# Patient Record
Sex: Female | Born: 1975 | Race: White | Hispanic: No | Smoking: Current every day smoker
Health system: Southern US, Community
[De-identification: ages and names within clinical notes are randomized; demographics above are authoritative.]

## PROBLEM LIST (undated history)

## (undated) DIAGNOSIS — R569 Unspecified convulsions: Secondary | ICD-10-CM

## (undated) DIAGNOSIS — I2699 Other pulmonary embolism without acute cor pulmonale: Secondary | ICD-10-CM

## (undated) DIAGNOSIS — I82409 Acute embolism and thrombosis of unspecified deep veins of unspecified lower extremity: Secondary | ICD-10-CM

## (undated) DIAGNOSIS — I10 Essential (primary) hypertension: Secondary | ICD-10-CM

## (undated) DIAGNOSIS — I639 Cerebral infarction, unspecified: Secondary | ICD-10-CM

---

## 2016-09-14 ENCOUNTER — Encounter (HOSPITAL_COMMUNITY): Payer: Self-pay | Admitting: Emergency Medicine

## 2016-09-14 ENCOUNTER — Emergency Department (HOSPITAL_COMMUNITY)
Admission: EM | Admit: 2016-09-14 | Discharge: 2016-09-14 | Disposition: A | Discharge status: Home / Self Care | Payer: Self-pay | Attending: Emergency Medicine | Admitting: Emergency Medicine

## 2016-09-14 DIAGNOSIS — Y9389 Activity, other specified: Secondary | ICD-10-CM | POA: Insufficient documentation

## 2016-09-14 DIAGNOSIS — W57XXXA Bitten or stung by nonvenomous insect and other nonvenomous arthropods, initial encounter: Secondary | ICD-10-CM | POA: Insufficient documentation

## 2016-09-14 DIAGNOSIS — Y999 Unspecified external cause status: Secondary | ICD-10-CM | POA: Insufficient documentation

## 2016-09-14 DIAGNOSIS — Y929 Unspecified place or not applicable: Secondary | ICD-10-CM | POA: Insufficient documentation

## 2016-09-14 DIAGNOSIS — F17210 Nicotine dependence, cigarettes, uncomplicated: Secondary | ICD-10-CM | POA: Insufficient documentation

## 2016-09-14 DIAGNOSIS — T63441A Toxic effect of venom of bees, accidental (unintentional), initial encounter: Secondary | ICD-10-CM | POA: Insufficient documentation

## 2016-09-14 DIAGNOSIS — I10 Essential (primary) hypertension: Secondary | ICD-10-CM | POA: Insufficient documentation

## 2016-09-14 HISTORY — DX: Acute embolism and thrombosis of unspecified deep veins of unspecified lower extremity: I82.409

## 2016-09-14 HISTORY — DX: Unspecified convulsions: R56.9

## 2016-09-14 HISTORY — DX: Other pulmonary embolism without acute cor pulmonale: I26.99

## 2016-09-14 HISTORY — DX: Essential (primary) hypertension: I10

## 2016-09-14 MED ORDER — DIPHENHYDRAMINE HCL 25 MG PO TABS: 25.0000 mg | ORAL_TABLET | Freq: Four times a day (QID) | ORAL | 0 refills | Status: AC

## 2016-09-14 NOTE — Discharge Instructions (Signed)
Read the information below.  Use the prescribed medication as directed.  Please discuss all new medications with your pharmacist.  You may return to the Emergency Department at any time for worsening condition or any new symptoms that concern you.   If you develop increased redness, swelling, pus draining from the wound, or fevers greater than 100.4, return to the ER immediately for a recheck.   °

## 2016-09-14 NOTE — ED Triage Notes (Signed)
Pt here with redness and pain to left arm from bee stings yesterday

## 2016-09-14 NOTE — ED Provider Notes (Signed)
[facility]-EMERGENCY DEPT Provider Note   CSN: [id number] Arrival date & time: 09/14/16  1359  By signing my name below, I, Sonum Patel, attest that this documentation has been prepared under the direction and in the presence of [facility], PA-C. Electronically Signed: Leone Payor, Scribe. 09/14/16. 2:56 PM.  History   Chief Complaint Chief Complaint  Patient presents with  . Allergic Reaction    The history is provided by the patient. No language interpreter was used.     HPI Comments: Jordan Butler is a 41 y.o. female who presents to the Emergency Department complaining of gradual onset, constant redness and pain to the left forearm that started today after bee stings. She states she was attempting to remove a nest when she was stung yesterday but her symptoms didn't start until today. She applied rubbing alcohol and hand sanitizer to the affected area which helped the itching. She notes associated minimal tongue itching.. Took benadryl this morning with relief.  Denies swelling of the tongue or difficulty swallowing or breathing.  No weakness or numbness of the left hand.    Past Medical History:  Diagnosis Date  . DVT (deep venous thrombosis) (HCC)   . Hypertension   . Pulmonary emboli (HCC)   . Seizures (HCC)     There are no active problems to display for this patient.   History reviewed. No pertinent surgical history.  OB History    No data available       Home Medications    Prior to Admission medications   Medication Sig Start Date End Date Taking? Authorizing Provider  diphenhydrAMINE (BENADRYL) 25 MG tablet Take 1 tablet (25 mg total) by mouth every 6 (six) hours. 09/14/16   Trixie Dredge, PA-C    Family History History reviewed. No pertinent family history.  Social History Social History  Substance Use Topics  . Smoking status: Current Every Day Smoker  . Smokeless tobacco: Never Used  . Alcohol use Yes     Comment: occ     Allergies   Patient has no known  allergies.   Review of Systems Review of Systems  Constitutional: Negative for fever.  HENT: Negative for facial swelling, sore throat and trouble swallowing.   Respiratory: Negative for choking, shortness of breath, wheezing and stridor.   Skin: Positive for rash and wound.  Allergic/Immunologic: Negative for immunocompromised state.     Physical Exam Updated Vital Signs BP (!) 171/107 (BP Location: Right Arm)   Pulse 82   Temp 98.3 F (36.8 C) (Oral)   Resp 18   SpO2 97%   Physical Exam  Constitutional: She appears well-developed and well-nourished. No distress.  HENT:  Head: Normocephalic and atraumatic.  Mouth/Throat: Oropharynx is clear and moist. No oropharyngeal exudate.  Eyes: Conjunctivae are normal.  Neck: Neck supple.  Cardiovascular: Normal rate and regular rhythm.   Pulmonary/Chest: Effort normal and breath sounds normal. No respiratory distress. She has no wheezes. She has no rales.  Musculoskeletal: Normal range of motion. She exhibits edema. She exhibits no tenderness.  Neurological: She is alert. No sensory deficit.  Skin: Skin is warm and dry. She is not diaphoretic. There is erythema.  Left volar forearm with erythema, edema, and warmth. No tenderness. No discharge. Left hand with full active ROM. Pulses intact. Sensation intact.   Nursing note and vitals reviewed.    ED Treatments / Results  DIAGNOSTIC STUDIES: Oxygen Saturation is 97% on RA, normal by my interpretation.    COORDINATION OF CARE: 2:56  PM Discussed treatment plan with pt at bedside and pt agreed to plan.   Labs (all labs ordered are listed, but only abnormal results are displayed) Labs Reviewed - No data to display  EKG  EKG Interpretation None       Radiology No results found.  Procedures Procedures (including critical care time)  Medications Ordered in ED Medications - No data to display   Initial Impression / Assessment and Plan / ED Course  I have reviewed the  triage vital signs and the nursing notes.  Pertinent labs & imaging results that were available during my care of the patient were reviewed by me and considered in my medical decision making (see chart for details).     Afebrile, nontoxic patient with multiple bee stings to left volar forearm that occurred yesterday.  Has localized erythema, edema related to localized reaction.  Doubt superinfection.  No airway concerns.   D/C home with benadryl, ice, elevation, return precautions.  Discussed result, findings, treatment, and follow up  with patient.  Pt given return precautions.  Pt verbalizes understanding and agrees with plan.       Final Clinical Impressions(s) / ED Diagnoses   Final diagnoses:  Local reaction to bee sting, accidental or unintentional, initial encounter    New Prescriptions Discharge Medication List as of 09/14/2016  2:54 PM    START taking these medications   Details  diphenhydrAMINE (BENADRYL) 25 MG tablet Take 1 tablet (25 mg total) by mouth every 6 (six) hours., Starting Thu 09/14/2016, Print       I personally performed the services described in this documentation, which was scribed in my presence. The recorded information has been reviewed and is accurate.    Trixie DredgeWest, September Mormile, PA-C 09/14/16 1548    Long, Arlyss RepressJoshua G, MD 09/15/16 [phone]

## 2016-12-13 ENCOUNTER — Emergency Department (HOSPITAL_COMMUNITY)
Admission: EM | Admit: 2016-12-13 | Discharge: 2016-12-13 | Disposition: A | Discharge status: Home / Self Care | Payer: Self-pay | Attending: Emergency Medicine | Admitting: Emergency Medicine

## 2016-12-13 ENCOUNTER — Other Ambulatory Visit: Payer: Self-pay

## 2016-12-13 ENCOUNTER — Emergency Department (HOSPITAL_COMMUNITY): Payer: Self-pay

## 2016-12-13 ENCOUNTER — Encounter (HOSPITAL_COMMUNITY): Payer: Self-pay | Admitting: Pharmacy Technician

## 2016-12-13 DIAGNOSIS — R202 Paresthesia of skin: Secondary | ICD-10-CM | POA: Insufficient documentation

## 2016-12-13 DIAGNOSIS — Z79899 Other long term (current) drug therapy: Secondary | ICD-10-CM | POA: Insufficient documentation

## 2016-12-13 DIAGNOSIS — Z7982 Long term (current) use of aspirin: Secondary | ICD-10-CM | POA: Insufficient documentation

## 2016-12-13 DIAGNOSIS — Z8673 Personal history of transient ischemic attack (TIA), and cerebral infarction without residual deficits: Secondary | ICD-10-CM | POA: Insufficient documentation

## 2016-12-13 DIAGNOSIS — R791 Abnormal coagulation profile: Secondary | ICD-10-CM

## 2016-12-13 DIAGNOSIS — I10 Essential (primary) hypertension: Secondary | ICD-10-CM

## 2016-12-13 DIAGNOSIS — G43909 Migraine, unspecified, not intractable, without status migrainosus: Secondary | ICD-10-CM

## 2016-12-13 HISTORY — DX: Cerebral infarction, unspecified: I63.9

## 2016-12-13 LAB — I-STAT TROPONIN, ED: TROPONIN I, POC: 0 ng/mL (ref 0.00–0.08)

## 2016-12-13 LAB — PROTIME-INR
INR: 1.04
Prothrombin Time: 13.5 seconds (ref 11.4–15.2)

## 2016-12-13 MED ORDER — KETOROLAC TROMETHAMINE 15 MG/ML IJ SOLN
15.0000 mg | Freq: Once | INTRAMUSCULAR | Status: AC
  Administered 2016-12-13: 15 mg via INTRAVENOUS
  Filled 2016-12-13: qty 1

## 2016-12-13 MED ORDER — SODIUM CHLORIDE 0.9 % IV BOLUS (SEPSIS)
1000.0000 mL | Freq: Once | INTRAVENOUS | Status: AC
  Administered 2016-12-13: 1000 mL via INTRAVENOUS

## 2016-12-13 MED ORDER — METOCLOPRAMIDE HCL 5 MG/ML IJ SOLN
5.0000 mg | Freq: Once | INTRAMUSCULAR | Status: AC
  Administered 2016-12-13: 5 mg via INTRAVENOUS
  Filled 2016-12-13: qty 2

## 2016-12-13 MED ORDER — OXYCODONE-ACETAMINOPHEN 5-325 MG PO TABS
1.0000 | ORAL_TABLET | Freq: Once | ORAL | Status: AC
  Administered 2016-12-13: 1 via ORAL
  Filled 2016-12-13: qty 1

## 2016-12-13 MED ORDER — DIPHENHYDRAMINE HCL 50 MG/ML IJ SOLN
25.0000 mg | Freq: Once | INTRAMUSCULAR | Status: AC
  Administered 2016-12-13: 25 mg via INTRAVENOUS
  Filled 2016-12-13: qty 1

## 2016-12-13 MED ORDER — WARFARIN SODIUM 5 MG PO TABS: 5.0000 mg | ORAL_TABLET | Freq: Every day | ORAL | 0 refills | Status: AC

## 2016-12-13 MED ORDER — MAGNESIUM SULFATE 2 GM/50ML IV SOLN
2.0000 g | Freq: Once | INTRAVENOUS | Status: AC
  Administered 2016-12-13: 2 g via INTRAVENOUS
  Filled 2016-12-13: qty 50

## 2016-12-13 MED ORDER — CLONIDINE HCL 0.1 MG PO TABS
0.1000 mg | ORAL_TABLET | Freq: Once | ORAL | Status: AC
  Administered 2016-12-13: 0.1 mg via ORAL
  Filled 2016-12-13: qty 1

## 2016-12-13 NOTE — ED Provider Notes (Signed)
[facility] Provider Note   CSN: [id number] Arrival date & time: 12/13/16  2008  History   Chief Complaint Chief Complaint  Patient presents with  . Migraine   HPI Jordan Butler is a 41 y.o. female.  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  Her pain began at 3:30AM this morning and has been gradually worsening throughout the day.  She tried placing ice packs onto her neck and head without relief.  Throughout the day, her headache worsened.  She tried Tramadol, Tylenol, ASA, Topamax, and Benadryl without relief of symptoms.  The headache is generalized and associated with photophobia.  She then noticed blurring of vision that started at ~4:45pm today.  Shortly thereafter, she began experiencing chest pressure as well as diaphoresis and nausea; these symptoms resolved with Zofran and lasted 5-10 minutes.  She has had intermittent numbness and tingling in her left upper and left lower extremity today. At this time, her headache is 10/10 in severity.  Per EMS report, the patient's initial BP was 230/183mmHg.   The history is provided by the patient, medical records and the EMS personnel. No language interpreter was used.   Past Medical History:  Diagnosis Date  . DVT (deep venous thrombosis) (HCC)   . Hypertension   . Pulmonary emboli (HCC)   . Seizures (HCC)   . Stroke [facility])    There are no active problems to display for this patient.  History reviewed. No pertinent surgical history.  OB History    No data available     Home Medications    Prior to Admission medications   Medication Sig Start Date End Date Taking? Authorizing Provider  acetaminophen (TYLENOL) 500 MG tablet Take 1,000 mg by mouth every 6 (six) hours as needed (for headaches).   Yes [provider]  aspirin EC 325 MG  tablet Take 325 mg by mouth 2 (two) times daily.   Yes [provider]  clonazePAM (KLONOPIN) 0.5 MG tablet Take 0.5-1 mg by mouth See admin instructions. 0.5 mg two times a day and 1 mg at bedtime   Yes [provider]  cloNIDine (CATAPRES) 0.1 MG tablet Take 0.1 mg by mouth 2 (two) times daily.   Yes [provider]  cyclobenzaprine (FLEXERIL) 10 MG tablet Take 10 mg by mouth 3 (three) times daily as needed for muscle spasms.   Yes [provider]  diphenhydrAMINE (BENADRYL) 25 MG tablet Take 1 tablet (25 mg total) by mouth every 6 (six) hours. Patient taking differently: Take 25 mg by mouth every 6 (six) hours as needed for allergies.  09/14/16  Yes West, Emily, PA-C  hydrochlorothiazide (HYDRODIURIL) 25 MG tablet Take 25 mg by mouth daily.   Yes [provider]  levETIRAcetam (KEPPRA) 750 MG tablet Take 1,500 mg by mouth 2 (two) times daily.   Yes [provider]  lisinopril-hydrochlorothiazide (PRINZIDE,ZESTORETIC) 20-12.5 MG tablet Take 1 tablet by mouth 2 (two) times daily.   Yes [provider]  topiramate (TOPAMAX) 25 MG tablet Take 50 mg by mouth 4 (four) times daily.   Yes [provider]  traMADol (ULTRAM) 50 MG tablet Take 50 mg by mouth every 6 (six) hours as needed (for pain).   Yes [provider]  warfarin (COUMADIN) 5 MG tablet Take 1 tablet (5 mg total)  by mouth at bedtime. Sun/Mon/Tues/Wed/Thurs/Fri/Sat 12/13/16 12/27/16  Levester Fresh, MD   Family History No family history on file.  Social History Social History  Substance Use Topics  . Smoking status: Current Every Day Smoker  . Smokeless tobacco: Never Used  . Alcohol use Yes     Comment: occ   Allergies   Latex  Review of Systems Review of Systems  Constitutional: Negative for chills and fever.  HENT: Negative for ear pain and sore throat.   Eyes: Positive for photophobia. Negative for pain and visual disturbance.  Respiratory:  Negative for cough and shortness of breath.   Cardiovascular: Positive for chest pain (now resolved). Negative for palpitations.  Gastrointestinal: Positive for nausea. Negative for abdominal pain, blood in stool, constipation, diarrhea and vomiting.  Genitourinary: Negative for dysuria and hematuria.  Musculoskeletal: Negative for arthralgias and back pain.  Skin: Negative for color change and rash.  Allergic/Immunologic: Negative for immunocompromised state.  Neurological: Positive for numbness (LUE, LLE) and headaches. Negative for seizures and syncope.  Hematological: Negative.   Psychiatric/Behavioral: Negative.   All other systems reviewed and are negative.  Physical Exam Updated Vital Signs BP (!) 181/128   Pulse 71   Temp 97.8 F (36.6 C) (Oral)   Resp 18   SpO2 97%   Physical Exam  Constitutional: She is oriented to person, place, and time. She appears well-developed and well-nourished. No distress.  HENT:  Head: Normocephalic and atraumatic.  Mouth/Throat: Oropharynx is clear and moist.  Eyes: Pupils are equal, round, and reactive to light. Conjunctivae are normal.  Pupils dilated and equal  Neck: Neck supple.  Cardiovascular: Normal rate, regular rhythm, normal heart sounds and intact distal pulses.   No murmur heard. Pulmonary/Chest: Effort normal and breath sounds normal. No respiratory distress. She has no wheezes.  Abdominal: Soft. There is no tenderness. There is no guarding.  Musculoskeletal: She exhibits no edema.  Neurological: She is alert and oriented to person, place, and time. She displays normal reflexes. No cranial nerve deficit or sensory deficit. She exhibits normal muscle tone. Coordination normal.  Skin: Skin is warm and dry.  Psychiatric: She has a normal mood and affect. Her behavior is normal. Judgment and thought content normal.  Nursing note and vitals reviewed.  ED Treatments / Results  Labs (all labs ordered are listed, but only abnormal  results are displayed) Labs Reviewed  PROTIME-INR  RAPID URINE DRUG SCREEN, HOSP PERFORMED  I-STAT TROPONIN, ED    EKG  EKG Interpretation None      Radiology Ct Head Wo Contrast  Result Date: 12/13/2016 CLINICAL DATA:  41 year old female with a history of headache and photophobia EXAM: CT HEAD WITHOUT CONTRAST TECHNIQUE: Contiguous axial images were obtained from the base of the skull through the vertex without intravenous contrast. COMPARISON:  None. FINDINGS: Brain: No acute intracranial hemorrhage. No midline shift or mass effect. Gray-white differentiation maintained. Unremarkable appearance of the ventricular system. Vascular: Unremarkable. Skull: No acute fracture.  No aggressive bone lesion identified. Sinuses/Orbits: Unremarkable appearance of the orbits. Mastoid air cells clear. No middle ear effusion. No significant sinus disease. Other: None IMPRESSION: No CT evidence of acute intracranial abnormality. Electronically Signed   By: Gilmer Mor D.O.   On: 12/13/2016 21:15    Procedures Procedures (including critical care time)  Medications Ordered in ED Medications  sodium chloride 0.9 % bolus 1,000 mL (0 mLs Intravenous Stopped 12/13/16 2349)  diphenhydrAMINE (BENADRYL) injection 25 mg (25 mg Intravenous Given 12/13/16 2122)  metoCLOPramide (  REGLAN) injection 5 mg (5 mg Intravenous Given 12/13/16 2122)  ketorolac (TORADOL) 15 MG/ML injection 15 mg (15 mg Intravenous Given 12/13/16 2228)  cloNIDine (CATAPRES) tablet 0.1 mg (0.1 mg Oral Given 12/13/16 2227)  oxyCODONE-acetaminophen (PERCOCET/ROXICET) 5-325 MG per tablet 1 tablet (1 tablet Oral Given 12/13/16 2227)  magnesium sulfate IVPB 2 g 50 mL (0 g Intravenous Stopped 12/13/16 2350)    Initial Impression / Assessment and Plan / ED Course  I have reviewed the triage vital signs and the nursing notes.  Pertinent labs & imaging results that were available during my care of the patient were reviewed by me and considered  in my medical decision making (see chart for details).    At the time of my initial assessment, the patient's blood pressure was 151/106 with a normal heart rate.  She reported that she took her home anti-hypertensives today as prescribed.  My initial differential diagnosis included migraine headache, tension headache, CVA, drug ingestion, medication non-compliance, hypertensive urgency, and ACS.  Pertinent labs included a normal troponin and a subtherapeutic INR 1.04.  EKG within normal heart rate in normal sinus rhythm. No axis deviation. Normal PR interval, narrow QRS complex, and mildly prolonged QTc.  No evidence of arrhythmia, ischemia, or infarct.  Imaging studies included a head CT with no acute intracranial abnormalities.  The patient was given a migraine cocktail for her symptoms with minimal relief upon re-evaluation (7/10 in severity).  The patient was also given clonidine, which is a home medication, for her blood pressure, which increased to 190s/130s.  I then administered Percocet as well as IV magnesium, with significant reduction in headache severity to 3/10.    Based on the above findings, I suspect the patient is most likely suffering from a migraine headache at this time.  No hemorrhagic stroke on head CT.  Her headache symptoms may also be exacerbated by her current hypertension.    I discussed the above results with the patient who verbalized understanding.  Return precautions and follow-up plans discussed.  I gave the patient specific instructions to follow up within the next 2 days with her PCP. She was given a 2 week prescription for warfarin, and told to take this medication daily as prescribed, again with close follow-up for INR reevaluation.  The patient was able to ambulate without assistance and was discharged in stable condition.  Final Clinical Impressions(s) / ED Diagnoses   Final diagnoses:  Migraine without status migrainosus, not intractable, unspecified migraine type    Hypertension, unspecified type  Subtherapeutic international normalized ratio (INR)   New Prescriptions Discharge Medication List as of 12/13/2016 11:28 PM       Levester Fresh, MD 12/13/16 2359    Cathren Laine, MD 12/14/16 0011

## 2016-12-13 NOTE — ED Triage Notes (Signed)
Pt presents to the ED via [facility] with reports of HTN/Migraine onset at 0330 today. Pt with hx HTN, seizures, stroke January this year. Pt presents with photophobia. Stroke screen 0 with EMS. Initial BP with EMS 230/150. Pain 7/10 upon arrival. Other VSS.

## 2016-12-13 NOTE — ED Notes (Signed)
Patient transported to CT 

## 2016-12-13 NOTE — Discharge Instructions (Addendum)
Take all of your medications as prescribed.  Follow-up with your PCP within the next 2-3 days.  Your INR was subtherapeutic today, and you have been given a 2-week supply of Warfarin.  Please see your primary doctor for further evaluation.

## 2018-07-17 IMAGING — CT CT HEAD W/O CM
3 series · 16 of 47 positions shown, 19 images · non-contrast
Comparison: None.

CLINICAL DATA: 41-year-old female with a history of headache and
photophobia

EXAM:
CT HEAD WITHOUT CONTRAST
TECHNIQUE: Contiguous axial images were obtained from the base of the skull
through the vertex without intravenous contrast.

[Series 3: head 5.0 h30s · axial · 0.43mm/px · z∈[+128,+268]mm · 10 of 34 slices shown, 13 images]
[im 3/34  brain]
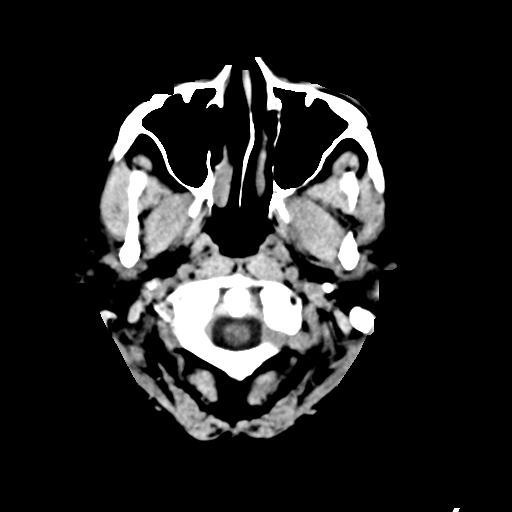
[im 3/34  bone]
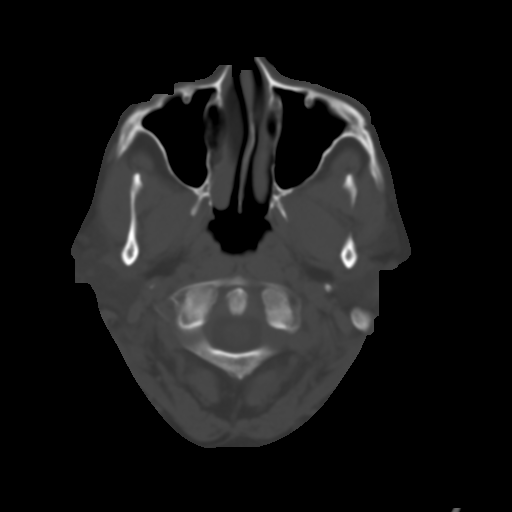
[im 6/34  brain]
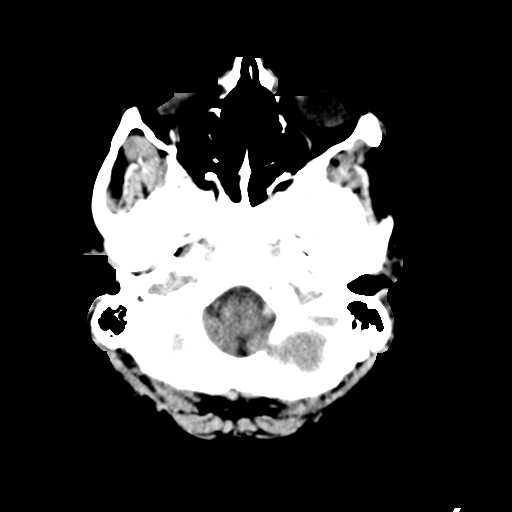
[im 10/34  brain]
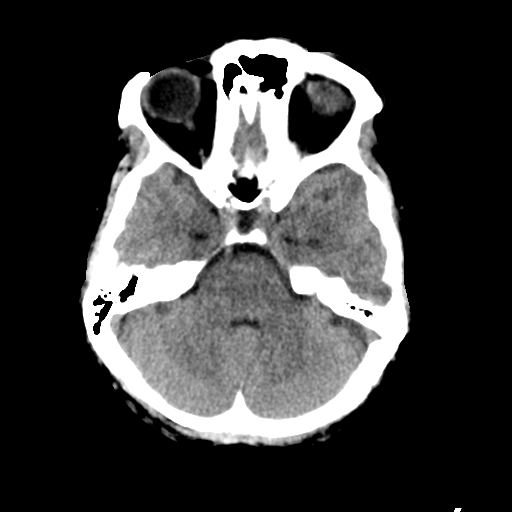
[im 12/34  brain]
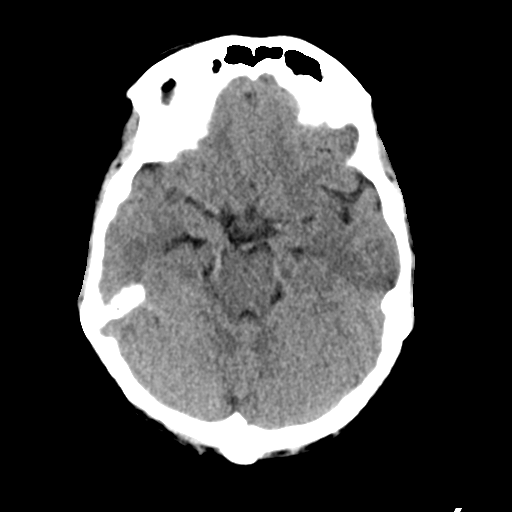
[im 15/34  brain]
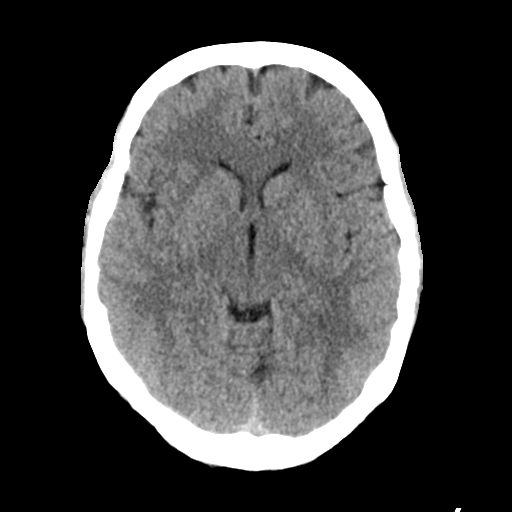
[im 15/34  bone]
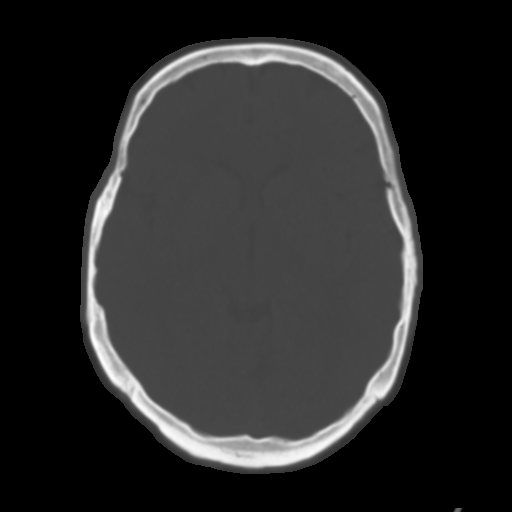
[im 19/34  brain]
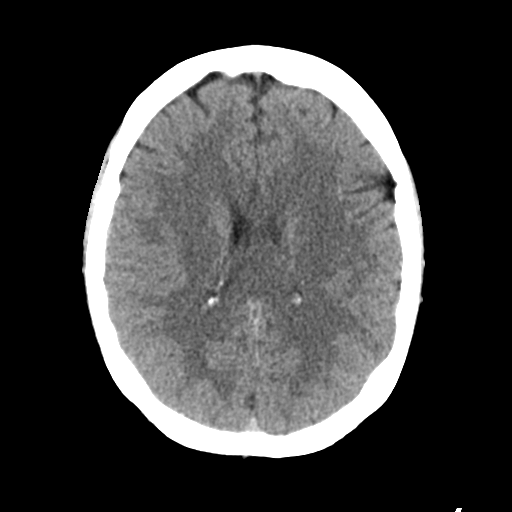
[im 22/34  brain]
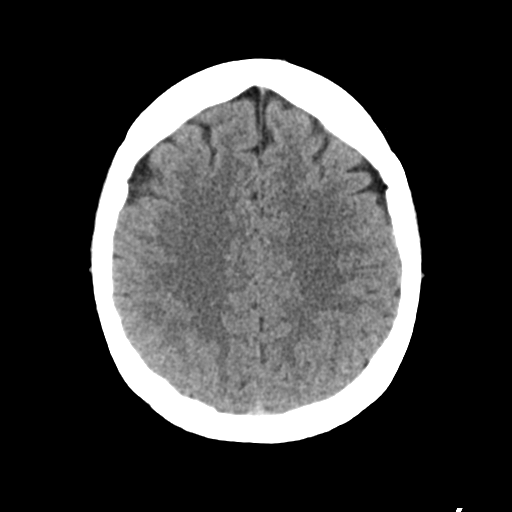
[im 26/34  brain]
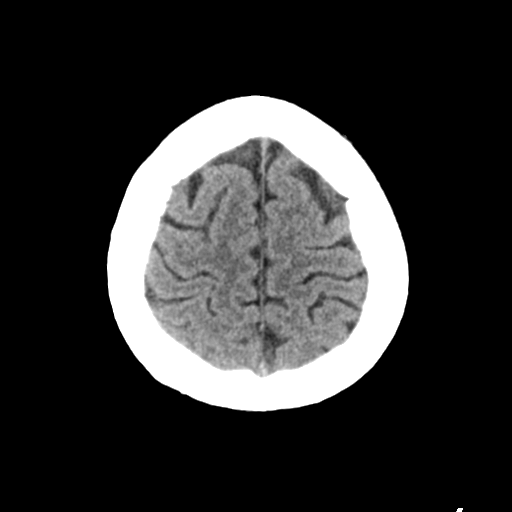
[im 28/34  brain]
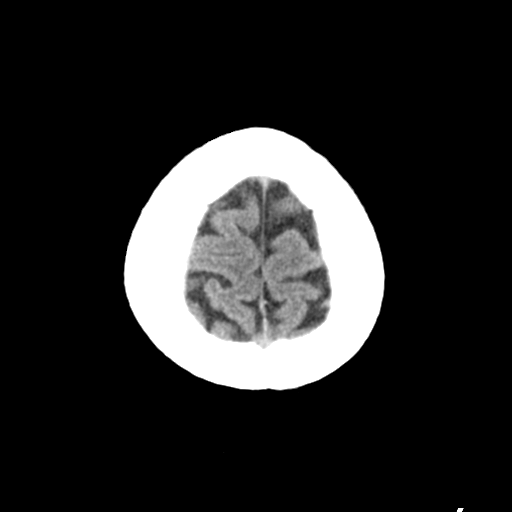
[im 28/34  bone]
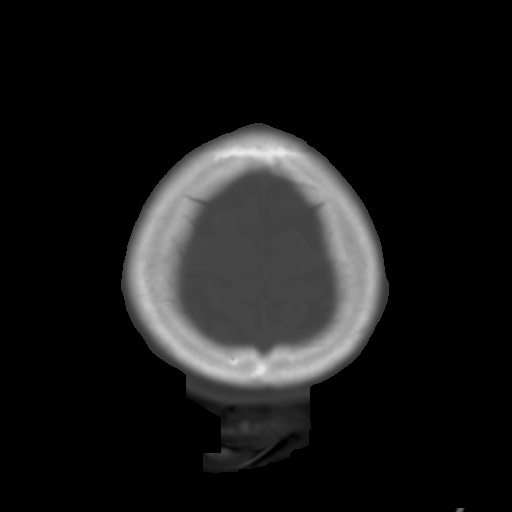
[im 31/34  brain]
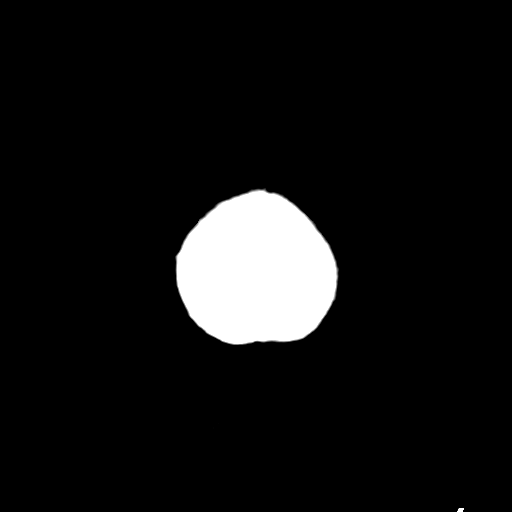

[Series 5: head 3.0 mpr cor · coronal · 0.32mm/px · 3 of 69 slices shown]
[im 23/69  brain]
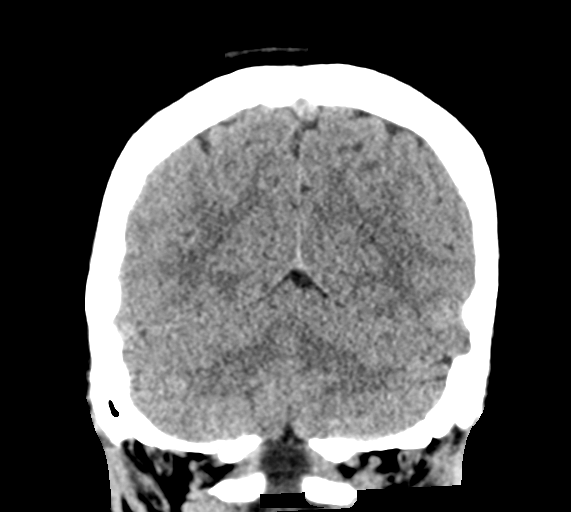
[im 31/69  brain]
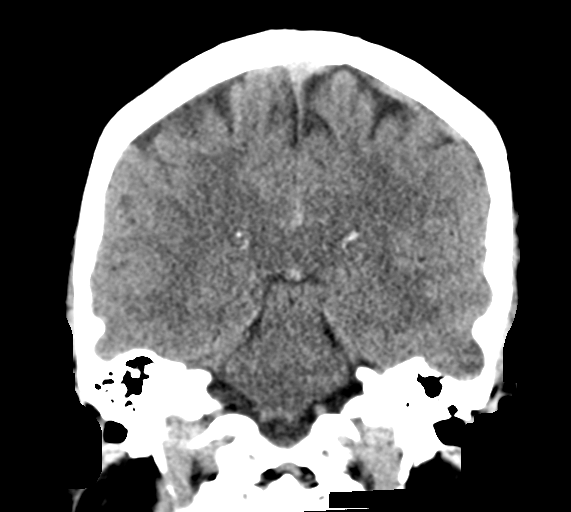
[im 38/69  brain]
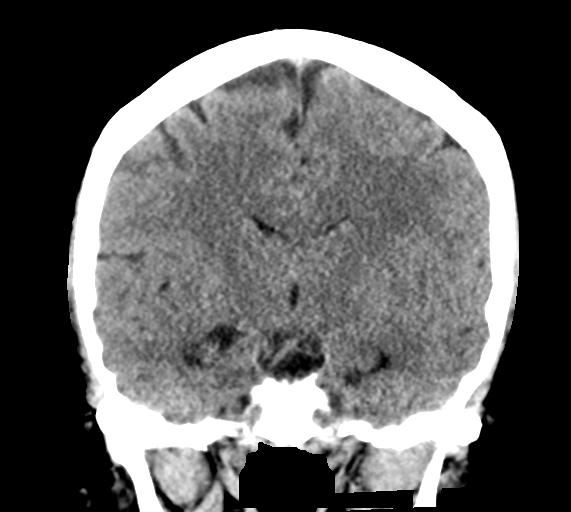

[Series 6: head 3.0 mpr sag · sagittal · 0.32mm/px · 3 of 55 slices shown]
[im 19/55  brain]
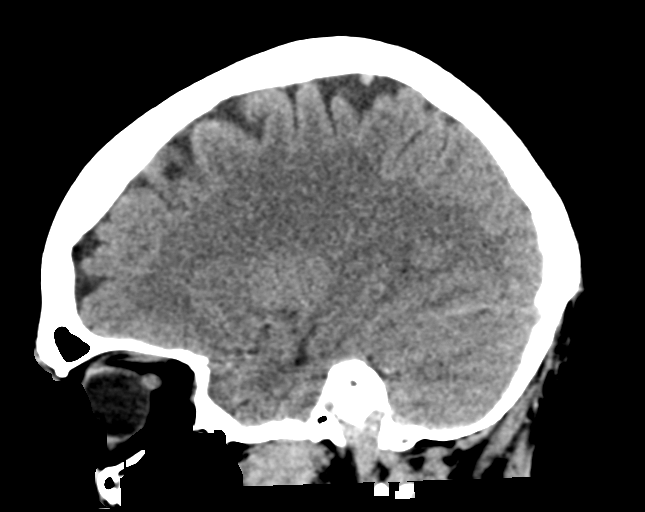
[im 28/55  brain]
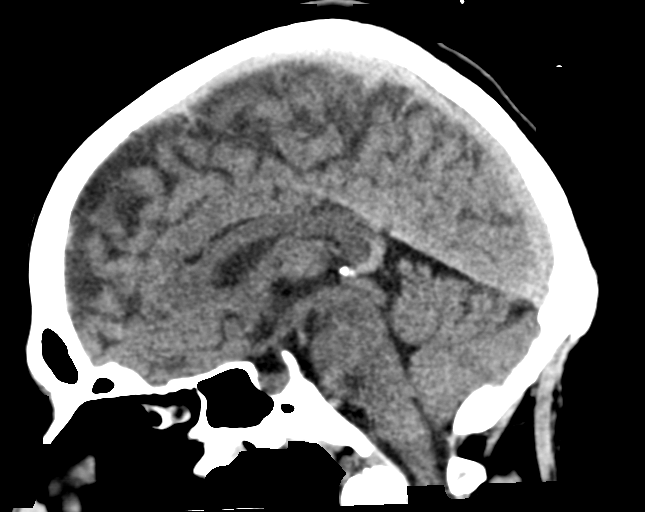
[im 37/55  brain]
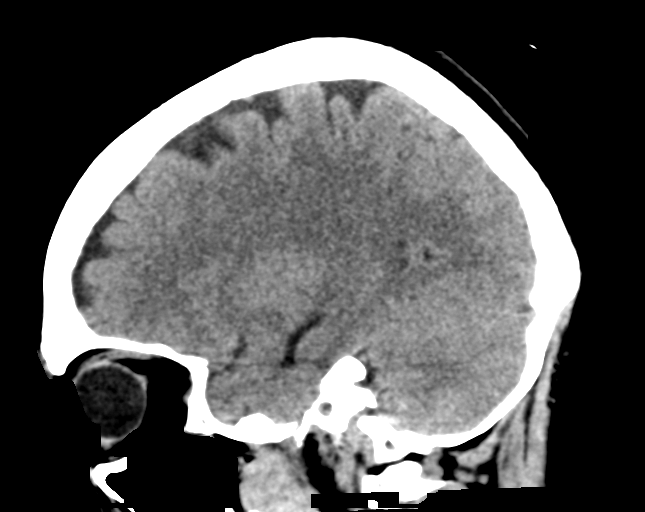

[16 of 47 positions shown; findings below may reference images not displayed]

FINDINGS: Brain: No acute intracranial hemorrhage. No midline shift or mass
effect. Gray-white differentiation maintained. Unremarkable
appearance of the ventricular system.

Vascular: Unremarkable.

Skull: No acute fracture.  No aggressive bone lesion identified.

Sinuses/Orbits: Unremarkable appearance of the orbits. Mastoid air
cells clear. No middle ear effusion. No significant sinus disease.

Other: None
IMPRESSION: No CT evidence of acute intracranial abnormality.

## 2020-06-01 ENCOUNTER — Encounter (HOSPITAL_COMMUNITY): Payer: Self-pay

## 2020-06-01 ENCOUNTER — Emergency Department (HOSPITAL_COMMUNITY): Payer: Self-pay

## 2020-06-01 ENCOUNTER — Other Ambulatory Visit: Payer: Self-pay

## 2020-06-01 ENCOUNTER — Emergency Department (HOSPITAL_COMMUNITY)
Admission: EM | Admit: 2020-06-01 | Discharge: 2020-06-01 | Disposition: A | Discharge status: Home / Self Care | Payer: Self-pay | Attending: Emergency Medicine | Admitting: Emergency Medicine

## 2020-06-01 DIAGNOSIS — Z91040 Latex allergy status: Secondary | ICD-10-CM | POA: Insufficient documentation

## 2020-06-01 DIAGNOSIS — Z20822 Contact with and (suspected) exposure to covid-19: Secondary | ICD-10-CM | POA: Insufficient documentation

## 2020-06-01 DIAGNOSIS — Z8673 Personal history of transient ischemic attack (TIA), and cerebral infarction without residual deficits: Secondary | ICD-10-CM | POA: Insufficient documentation

## 2020-06-01 DIAGNOSIS — R5383 Other fatigue: Secondary | ICD-10-CM | POA: Insufficient documentation

## 2020-06-01 DIAGNOSIS — R0789 Other chest pain: Secondary | ICD-10-CM | POA: Insufficient documentation

## 2020-06-01 DIAGNOSIS — Z86718 Personal history of other venous thrombosis and embolism: Secondary | ICD-10-CM | POA: Insufficient documentation

## 2020-06-01 DIAGNOSIS — Z79899 Other long term (current) drug therapy: Secondary | ICD-10-CM | POA: Insufficient documentation

## 2020-06-01 DIAGNOSIS — Z7982 Long term (current) use of aspirin: Secondary | ICD-10-CM | POA: Insufficient documentation

## 2020-06-01 DIAGNOSIS — F17200 Nicotine dependence, unspecified, uncomplicated: Secondary | ICD-10-CM | POA: Insufficient documentation

## 2020-06-01 DIAGNOSIS — Z7901 Long term (current) use of anticoagulants: Secondary | ICD-10-CM | POA: Insufficient documentation

## 2020-06-01 DIAGNOSIS — I10 Essential (primary) hypertension: Secondary | ICD-10-CM | POA: Insufficient documentation

## 2020-06-01 LAB — BASIC METABOLIC PANEL
Anion gap: 10 (ref 5–15)
BUN: 18 mg/dL (ref 6–20)
CO2: 21 mmol/L — ABNORMAL LOW (ref 22–32)
Calcium: 9 mg/dL (ref 8.9–10.3)
Chloride: 107 mmol/L (ref 98–111)
Creatinine, Ser: 0.88 mg/dL (ref 0.44–1.00)
GFR, Estimated: >60 mL/min (ref >60)
Glucose, Bld: 111 mg/dL — ABNORMAL HIGH (ref 70–99)
Potassium: 3.5 mmol/L (ref 3.5–5.1)
Sodium: 138 mmol/L (ref 135–145)

## 2020-06-01 LAB — CBC
HCT: 41.7 % (ref 36.0–46.0)
Hemoglobin: 14 g/dL (ref 12.0–15.0)
MCH: 30.6 pg (ref 26.0–34.0)
MCHC: 33.6 g/dL (ref 30.0–36.0)
MCV: 91 fL (ref 80.0–100.0)
Platelets: 267 10*3/uL (ref 150–400)
RBC: 4.58 MIL/uL (ref 3.87–5.11)
RDW: 12.3 % (ref 11.5–15.5)
WBC: 7.7 10*3/uL (ref 4.0–10.5)
nRBC: 0 % (ref 0.0–0.2)

## 2020-06-01 LAB — POC SARS CORONAVIRUS 2 AG -  ED: SARS Coronavirus 2 Ag: NEGATIVE

## 2020-06-01 LAB — PROTIME-INR
INR: 1.1 (ref 0.8–1.2)
Prothrombin Time: 13.4 seconds (ref 11.4–15.2)

## 2020-06-01 LAB — I-STAT BETA HCG BLOOD, ED (MC, WL, AP ONLY): I-stat hCG, quantitative: <5 m[IU]/mL (ref <5)

## 2020-06-01 LAB — TROPONIN I (HIGH SENSITIVITY)
Troponin I (High Sensitivity): 4 ng/L (ref <18)
Troponin I (High Sensitivity): 4 ng/L (ref <18)

## 2020-06-01 MED ORDER — SODIUM CHLORIDE 0.9 % IV BOLUS
500.0000 mL | Freq: Once | INTRAVENOUS | Status: AC
  Administered 2020-06-01: 500 mL via INTRAVENOUS

## 2020-06-01 MED ORDER — PREDNISONE 20 MG PO TABS: 40.0000 mg | ORAL_TABLET | Freq: Every day | ORAL | 0 refills | Status: DC

## 2020-06-01 MED ORDER — PREDNISONE 20 MG PO TABS
60.0000 mg | ORAL_TABLET | ORAL | Status: AC
  Administered 2020-06-01: 60 mg via ORAL
  Filled 2020-06-01: qty 3

## 2020-06-01 MED ORDER — KETOROLAC TROMETHAMINE 30 MG/ML IJ SOLN
15.0000 mg | Freq: Once | INTRAMUSCULAR | Status: AC
  Administered 2020-06-01: 15 mg via INTRAVENOUS
  Filled 2020-06-01: qty 1

## 2020-06-01 MED ORDER — ACETAMINOPHEN 500 MG PO TABS
1000.0000 mg | ORAL_TABLET | Freq: Once | ORAL | Status: AC
  Administered 2020-06-01: 1000 mg via ORAL
  Filled 2020-06-01: qty 2

## 2020-06-01 MED ORDER — IOHEXOL 300 MG/ML  SOLN
50.0000 mL | Freq: Once | INTRAMUSCULAR | Status: AC | PRN
  Administered 2020-06-01: 50 mL via INTRAVENOUS

## 2020-06-01 MED ORDER — PREDNISONE 20 MG PO TABS: 40.0000 mg | ORAL_TABLET | Freq: Every day | ORAL | 0 refills | Status: AC

## 2020-06-01 NOTE — ED Provider Notes (Signed)
[facility] EMERGENCY DEPARTMENT Provider Note   CSN: [id number] Arrival date & time: 06/01/20  1403     History No chief complaint on file.   Jordan Butler is a 45 y.o. female.  HPI Patient presents with concern of chest pressure, sensation present at rest and with exertion.  She was in usual state of health, yesterday, essentially, but today awoke with chest pressure.  Since onset today has been persistent, worse with exertion.  No syncope, no substantial dyspnea.  There is associated fatigue.  Pressure is superior anterior thoracic, persistent pressure-like heaviness. Patient has a history of DVT, PE.  No longer on anticoagulant, stating that this medication was stopped less than 1 year ago. No recent notable changes.    Past Medical History:  Diagnosis Date  . DVT (deep venous thrombosis) (HCC)   . Hypertension   . Pulmonary emboli (HCC)   . Seizures (HCC)   . Stroke [facility])     There are no problems to display for this patient.   History reviewed. No pertinent surgical history.   OB History   No obstetric history on file.     No family history on file.  Social History   Tobacco Use  . Smoking status: Current Every Day Smoker  . Smokeless tobacco: Never Used  Substance Use Topics  . Alcohol use: Yes    Comment: occ  . Drug use: No    Home Medications Prior to Admission medications   Medication Sig Start Date End Date Taking? Authorizing Provider  predniSONE (DELTASONE) 20 MG tablet Take 2 tablets (40 mg total) by mouth daily with breakfast. For the next four days 06/01/20  Yes Gerhard Munch, MD  acetaminophen (TYLENOL) 500 MG tablet Take 1,000 mg by mouth every 6 (six) hours as needed (for headaches).    [provider]  aspirin EC 325 MG tablet Take 325 mg by mouth 2 (two) times daily.    [provider]  clonazePAM (KLONOPIN) 0.5 MG tablet Take 0.5-1 mg by mouth See admin instructions. 0.5 mg two times a day and 1 mg at  bedtime    [provider]  cloNIDine (CATAPRES) 0.1 MG tablet Take 0.1 mg by mouth 2 (two) times daily.    [provider]  cyclobenzaprine (FLEXERIL) 10 MG tablet Take 10 mg by mouth 3 (three) times daily as needed for muscle spasms.    [provider]  diphenhydrAMINE (BENADRYL) 25 MG tablet Take 1 tablet (25 mg total) by mouth every 6 (six) hours. Patient taking differently: Take 25 mg by mouth every 6 (six) hours as needed for allergies.  09/14/16   Trixie Dredge, PA-C  hydrochlorothiazide (HYDRODIURIL) 25 MG tablet Take 25 mg by mouth daily.    [provider]  levETIRAcetam (KEPPRA) 750 MG tablet Take 1,500 mg by mouth 2 (two) times daily.    [provider]  lisinopril-hydrochlorothiazide (PRINZIDE,ZESTORETIC) 20-12.5 MG tablet Take 1 tablet by mouth 2 (two) times daily.    [provider]  topiramate (TOPAMAX) 25 MG tablet Take 50 mg by mouth 4 (four) times daily.    [provider]  traMADol (ULTRAM) 50 MG tablet Take 50 mg by mouth every 6 (six) hours as needed (for pain).    [provider]  warfarin (COUMADIN) 5 MG tablet Take 1 tablet (5 mg total) by mouth at bedtime. Sun/Mon/Tues/Wed/Thurs/Fri/Sat 12/13/16 12/27/16  Levester Fresh, MD    Allergies    Latex  Review of Systems  Review of Systems  Constitutional:       Per HPI, otherwise negative  HENT:       Per HPI, otherwise negative  Respiratory:       Per HPI, otherwise negative  Cardiovascular:       Per HPI, otherwise negative  Gastrointestinal: Negative for vomiting.  Endocrine:       Negative aside from HPI  Genitourinary:       Neg aside from HPI   Musculoskeletal:       Per HPI, otherwise negative  Skin: Negative.   Neurological: Negative for syncope.    Physical Exam Updated Vital Signs BP (!) 157/103   Pulse 68   Temp 97.8 F (36.6 C) (Oral)   Resp 14   SpO2 96%   Physical Exam Vitals and nursing note reviewed.   Constitutional:      General: She is not in acute distress.    Appearance: She is well-developed.  HENT:     Head: Normocephalic and atraumatic.  Eyes:     Conjunctiva/sclera: Conjunctivae normal.  Cardiovascular:     Rate and Rhythm: Normal rate and regular rhythm.  Pulmonary:     Effort: Pulmonary effort is normal. No respiratory distress.     Breath sounds: Normal breath sounds. No stridor.  Abdominal:     General: There is no distension.  Skin:    General: Skin is warm and dry.  Neurological:     Mental Status: She is alert and oriented to person, place, and time.     Cranial Nerves: No cranial nerve deficit.      ED Results / Procedures / Treatments   Labs (all labs ordered are listed, but only abnormal results are displayed) Labs Reviewed  BASIC METABOLIC PANEL - Abnormal; Notable for the following components:      Result Value   CO2 21 (*)    Glucose, Bld 111 (*)    All other components within normal limits  CBC  PROTIME-INR  I-STAT BETA HCG BLOOD, ED ([facility], [facility], [facility] ONLY)  POC SARS CORONAVIRUS 2 AG -  ED  TROPONIN I (HIGH SENSITIVITY)  TROPONIN I (HIGH SENSITIVITY)    EKG EKG Interpretation  Date/Time:  Tuesday June 01 2020 14:12:29 EDT Ventricular Rate:  74 PR Interval:  148 QRS Duration: 98 QT Interval:  400 QTC Calculation: 444 R Axis:   30 Text Interpretation: Normal sinus rhythm Minimal voltage criteria for LVH, may be normal variant ( R in aVL ) ST-t wave abnormality Abnormal ECG Confirmed by Gerhard Munch [phone]) on 06/01/2020 7:25:03 PM   Radiology DG Chest 2 View  Result Date: 06/01/2020 CLINICAL DATA:  LEFT-sided weakness EXAM: CHEST - 2 VIEW COMPARISON:  None. FINDINGS: Normal mediastinum and cardiac silhouette. Normal pulmonary vasculature. No evidence of effusion, infiltrate, or pneumothorax. No acute bony abnormality. IMPRESSION: Normal chest radiograph. Electronically Signed   By: Genevive Bi M.D.   On: 06/01/2020 14:55   CT Angio  Chest PE W/Cm &/Or Wo Cm  Result Date: 06/01/2020 CLINICAL DATA:  Shortness of breath EXAM: CT ANGIOGRAPHY CHEST WITH CONTRAST TECHNIQUE: Multidetector CT imaging of the chest was performed using the standard protocol during bolus administration of intravenous contrast. Multiplanar CT image reconstructions and MIPs were obtained to evaluate the vascular anatomy. CONTRAST:  33mL OMNIPAQUE IOHEXOL 300 MG/ML  SOLN COMPARISON:  None. FINDINGS: Cardiovascular: Thoracic aorta is not well opacified due to the timing of the contrast bolus. No aortic dilatation is seen. No cardiac enlargement is  noted. Very minimal coronary calcifications are seen. The pulmonary artery shows a normal branching pattern without evidence of filling defect to suggest pulmonary embolism. Mediastinum/Nodes: Thoracic inlet is within normal limits. Multiple calcified hilar nodes are seen consistent with prior granulomatous disease. Esophagus as visualized is within normal limits. Lungs/Pleura: Lungs are well aerated bilaterally. A soft tissue nodule is noted in the posterior aspect of the left upper lobe best seen on image number 60 of series 6 with multiple calcifications within consistent with a granulomatous mass. No sizable effusion or pneumothorax is noted. No other parenchymal nodule is seen. Upper Abdomen: Visualized upper abdomen shows no acute abnormality. Musculoskeletal: No chest wall abnormality. No acute or significant osseous findings. Review of the MIP images confirms the above findings. IMPRESSION: Changes consistent with prior granulomatous disease. No acute abnormality noted. No evidence of pulmonary emboli. Electronically Signed   By: Alcide CleverMark  Lukens M.D.   On: 06/01/2020 21:18    Procedures Procedures   Medications Ordered in ED Medications  predniSONE (DELTASONE) tablet 60 mg (has no administration in time range)  sodium chloride 0.9 % bolus 500 mL (0 mLs Intravenous Stopped 06/01/20 1916)  ketorolac (TORADOL) 30 MG/ML  injection 15 mg (15 mg Intravenous Given 06/01/20 1812)  acetaminophen (TYLENOL) tablet 1,000 mg (1,000 mg Oral Given 06/01/20 1812)  iohexol (OMNIPAQUE) 300 MG/ML solution 50 mL (50 mLs Intravenous Contrast Given 06/01/20 2108)    ED Course  I have reviewed the triage vital signs and the nursing notes.  Pertinent labs & imaging results that were available during my care of the patient were reviewed by me and considered in my medical decision making (see chart for details).    9:55 PM On repeat exam patient is awake, alert, in no distress.  Neck conversation at today's findings.  2 normal troponins, nonischemic EKG reassuring for low suspicion of ACS.CT angiography without evidence for pulmonary embolism, pneumothorax, pneumonia. Patient has remained hemodynamically unremarkable, with no hypoxia throughout the course of monitoring in the emergency department, low suspicion for sustained arrhythmia. No lab evidence for bacteremia, sepsis.  Some suspicion for inflammatory condition given otherwise reassuring findings.  Patient is mildly hypertensive, but with no evidence for endorgan damage.  Patient and I discussed additional interventions for pain management, patient has preference for discharge with follow-up tomorrow with her physician which is already scheduled.  Given reassuring findings as above, this is reasonable.  Patient started on appropriate anti-inflammatories, discharged in stable condition. MDM Rules/Calculators/A&P   MDM Number of Diagnoses or Management Options Chest pressure: new, needed workup   Amount and/or Complexity of Data Reviewed Clinical lab tests: reviewed and ordered Tests in the radiology section of CPT: reviewed and ordered Tests in the medicine section of CPT: reviewed and ordered Decide to obtain previous medical records or to obtain history from someone other than the patient: yes Review and summarize past medical records: yes Independent visualization of  images, tracings, or specimens: yes  Risk of Complications, Morbidity, and/or Mortality Presenting problems: high Diagnostic procedures: high Management options: high  Critical Care Total time providing critical care: < 30 minutes  Patient Progress Patient progress: stable  Final Clinical Impression(s) / ED Diagnoses Final diagnoses:  Chest pressure    Rx / DC Orders ED Discharge Orders         Ordered    predniSONE (DELTASONE) 20 MG tablet  Daily with breakfast        06/01/20 2155  Gerhard Munch, MD 06/01/20 2157

## 2020-06-01 NOTE — Discharge Instructions (Signed)
As discussed, today's evaluation has been generally reassuring.  However, if you develop new, or concerning changes, do not hesitate to return here.  Otherwise, follow-up as scheduled with your physician tomorrow.

## 2020-06-01 NOTE — ED Notes (Signed)
Introduced self to pt. Pt reports continuing chest pain, mild sob. POC discussed with pt. IV team consult placed for appropriate IV for CT with contrast.

## 2020-06-01 NOTE — ED Triage Notes (Signed)
Patient complains of chest pain and bilateral calf pain for 2 days. Has been treated for DVTs in the past and no longer on thinners. Patient appears in no distress, alert and oriented, smoker

## 2020-06-01 NOTE — ED Notes (Signed)
ED Provider at bedside, DR lockwood
